# Patient Record
Sex: Female | Born: 1995 | Race: White | Hispanic: No | Marital: Married | Smoking: Never smoker
Health system: Southern US, Community
[De-identification: ages and names within clinical notes are randomized; demographics above are authoritative.]

## PROBLEM LIST (undated history)

## (undated) DIAGNOSIS — H409 Unspecified glaucoma: Secondary | ICD-10-CM

## (undated) DIAGNOSIS — R000 Tachycardia, unspecified: Secondary | ICD-10-CM

## (undated) DIAGNOSIS — F419 Anxiety disorder, unspecified: Secondary | ICD-10-CM

## (undated) HISTORY — PX: EYE SURGERY: SHX253

## (undated) HISTORY — DX: Tachycardia, unspecified: R00.0

## (undated) HISTORY — PX: WISDOM TOOTH EXTRACTION: SHX21

---
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
## 2016-07-08 ENCOUNTER — Encounter (HOSPITAL_COMMUNITY): Payer: Self-pay | Admitting: Emergency Medicine

## 2016-07-08 ENCOUNTER — Emergency Department (HOSPITAL_COMMUNITY)
Admission: EM | Admit: 2016-07-08 | Discharge: 2016-07-08 | Disposition: A | Discharge status: Home / Self Care | Payer: Managed Care, Other (non HMO) | Attending: Emergency Medicine | Admitting: Emergency Medicine

## 2016-07-08 DIAGNOSIS — Y929 Unspecified place or not applicable: Secondary | ICD-10-CM | POA: Insufficient documentation

## 2016-07-08 DIAGNOSIS — S3992XA Unspecified injury of lower back, initial encounter: Secondary | ICD-10-CM | POA: Diagnosis present

## 2016-07-08 DIAGNOSIS — S39012A Strain of muscle, fascia and tendon of lower back, initial encounter: Secondary | ICD-10-CM | POA: Insufficient documentation

## 2016-07-08 DIAGNOSIS — M545 Low back pain, unspecified: Secondary | ICD-10-CM

## 2016-07-08 DIAGNOSIS — Y999 Unspecified external cause status: Secondary | ICD-10-CM | POA: Diagnosis not present

## 2016-07-08 DIAGNOSIS — Y9389 Activity, other specified: Secondary | ICD-10-CM | POA: Insufficient documentation

## 2016-07-08 DIAGNOSIS — X500XXA Overexertion from strenuous movement or load, initial encounter: Secondary | ICD-10-CM | POA: Insufficient documentation

## 2016-07-08 MED ORDER — METHOCARBAMOL 500 MG PO TABS
500.0000 mg | ORAL_TABLET | Freq: Once | ORAL | Status: AC
  Administered 2016-07-08: 500 mg via ORAL
  Filled 2016-07-08: qty 1

## 2016-07-08 MED ORDER — IBUPROFEN 600 MG PO TABS: 600.0000 mg | ORAL_TABLET | Freq: Four times a day (QID) | ORAL | 0 refills | Status: AC | PRN

## 2016-07-08 MED ORDER — METHOCARBAMOL 500 MG PO TABS: 500.0000 mg | ORAL_TABLET | Freq: Two times a day (BID) | ORAL | 0 refills | Status: DC

## 2016-07-08 NOTE — ED Triage Notes (Signed)
Pt is unable to void. Pt advised that we need a urine specimen

## 2016-07-08 NOTE — ED Provider Notes (Signed)
[facility]-EMERGENCY DEPT Provider Note   CSN: [id number] Arrival date & time: 07/08/16  1141  By signing my name below, I, Connie Mcclain, attest that this documentation has been prepared under the direction and in the presence of Connie Piliyler Ashlinn Hemrick, PA-C.  Electronically Signed: Rosario AdieWilliam Andrew Mcclain, ED Scribe. 07/08/16. 12:28 PM.  History   Chief Complaint Chief Complaint  Patient presents with  . Back Pain   The history is provided by the patient. No language interpreter was used.   HPI Comments: Connie LudwigZoe Mcclain is a 21 y.o. female with no pertinent PMHx, who presents to the Emergency Department complaining of persistent, dull, right-sided lower back pain beginning several days ago, worsening since two days ago. She reports radiation of her pain laterally around her right-side. No recent injury, trauma, falls; however, she notes that prior to the onset of her pain she was helping a friend move and doing a lot of activity/lifting. She also reports that two days ago after cleaning her home that her pain had acutely worsened. Her pain is worse with movement and alleviated in certain positions. She took Flexeril yesterday with some temporary relief of her pain; today she took Naproxen and Ibuprofen today w/o relief of her pain. No h/o cancer or IVDU. She denies bowel/bladder incontinence, saddle anaesthesia/paraesthesias, fever, dysuria, abdominal pain, nausea, vomiting, or any other associated symptoms.   History reviewed. No pertinent past medical history.  There are no active problems to display for this patient.  Past Surgical History:  Procedure Laterality Date  . EYE SURGERY    . WISDOM TOOTH EXTRACTION     OB History    No data available     Home Medications    Prior to Admission medications   Not on File   Family History No family history on file.  Social History Social History  Substance Use Topics  . Smoking status: Not on file  . Smokeless tobacco: Never Used  . Alcohol use  No     Comment: social    Allergies   Patient has no known allergies.  Review of Systems Review of Systems  Gastrointestinal: Negative for abdominal pain, nausea and vomiting.  Genitourinary: Negative for dysuria.  Musculoskeletal: Positive for back pain and myalgias.  Neurological: Negative for weakness and numbness.       Negative for bowel/bladder incontinence.    Physical Exam Updated Vital Signs BP 115/70 (BP Location: Left Arm)   Pulse 100   Temp 97.7 F (36.5 C) (Oral)   Resp 17   Ht 4\' 10"  (1.473 m)   LMP 06/09/2016   SpO2 97%   Physical Exam  Constitutional: She is oriented to person, place, and time. Vital signs are normal. She appears well-developed and well-nourished. No distress.  HENT:  Head: Normocephalic and atraumatic.  Right Ear: Hearing normal.  Left Ear: Hearing normal.  Eyes: Conjunctivae and EOM are normal. Pupils are equal, round, and reactive to light.  Neck: Normal range of motion.  Cardiovascular: Normal rate and regular rhythm.   Pulmonary/Chest: Effort normal.  Abdominal: She exhibits no distension.  Musculoskeletal: Normal range of motion.  TTP of the right lower lumbar musculature. No midline spinous process tenderness. No palpable deformities.   Neurological: She is alert and oriented to person, place, and time.  Skin: Skin is warm and dry. No pallor.  Psychiatric: She has a normal mood and affect. Her speech is normal and behavior is normal. Thought content normal.  Nursing note and vitals reviewed.  ED  Treatments / Results  DIAGNOSTIC STUDIES: Oxygen Saturation is 97% on RA, normal by my interpretation.   COORDINATION OF CARE: 12:28 PM-Discussed next steps with pt. Pt verbalized understanding and is agreeable with the plan.   Labs (all labs ordered are listed, but only abnormal results are displayed) Labs Reviewed  POC URINE PREG, ED   EKG  EKG Interpretation None      Radiology No results  found.  Procedures Procedures  Medications Ordered in ED Medications  methocarbamol (ROBAXIN) tablet 500 mg (not administered)    Initial Impression / Assessment and Plan / ED Course  I have reviewed the triage vital signs and the nursing notes.  Pertinent labs & imaging results that were available during my care of the patient were reviewed by me and considered in my medical decision making (see chart for details).  I have reviewed and evaluated the relevant laboratory values. I obtained HPI from historian.  ED Course: Robaxin dose and rx for same  Assessment: Patient is a 21 y.o. female without pertinent hx who presents to the ED with lower back pain. Likely musculoskeletal. Lumbar strain. No neurological deficits appreciated. Patient is ambulatory. No warning symptoms of back pain including: fecal incontinence, urinary retention or overflow incontinence, night sweats, waking from sleep with back pain, unexplained fevers or weight loss, h/o cancer, IVDU, recent trauma. No concern for cauda equina, epidural abscess, or other serious cause of back pain. Conservative measures such as rest, ice/heat and pain medicine indicated with PCP follow-up if no improvement with conservative management.   Disposition/Plan:  D/c home Additional Verbal discharge instructions given and discussed with patient.  Pt Instructed to f/u with PCP in the next week for evaluation and treatment of symptoms. Return precautions given Pt acknowledges and agrees with plan  Supervising Physician Connie Mcclain, Connie Repress, MD  Final Clinical Impressions(s) / ED Diagnoses   Final diagnoses:  Acute right-sided low back pain without sciatica  Strain of lumbar region, initial encounter   New Prescriptions New Prescriptions   No medications on file   I personally performed the services described in this documentation, which was scribed in my presence. The recorded information has been reviewed and is accurate.    Connie Pili, PA-C 07/08/16 1233    Connie Mcclain, Connie Repress, MD 07/08/16 [phone]

## 2016-07-08 NOTE — ED Triage Notes (Signed)
Patient states that she has right lower back pain x couple days. Patient denies any injury, lifting, or urinary problems.  Patient states that pain is worse with movement.

## 2017-06-03 ENCOUNTER — Emergency Department (HOSPITAL_COMMUNITY)
Admission: EM | Admit: 2017-06-03 | Discharge: 2017-06-03 | Disposition: A | Discharge status: Home / Self Care | Payer: Managed Care, Other (non HMO) | Attending: Emergency Medicine | Admitting: Emergency Medicine

## 2017-06-03 ENCOUNTER — Emergency Department (HOSPITAL_COMMUNITY): Payer: Managed Care, Other (non HMO)

## 2017-06-03 ENCOUNTER — Encounter (HOSPITAL_COMMUNITY): Payer: Self-pay | Admitting: *Deleted

## 2017-06-03 ENCOUNTER — Other Ambulatory Visit: Payer: Self-pay

## 2017-06-03 DIAGNOSIS — S161XXA Strain of muscle, fascia and tendon at neck level, initial encounter: Secondary | ICD-10-CM | POA: Diagnosis not present

## 2017-06-03 DIAGNOSIS — S199XXA Unspecified injury of neck, initial encounter: Secondary | ICD-10-CM | POA: Diagnosis present

## 2017-06-03 DIAGNOSIS — F419 Anxiety disorder, unspecified: Secondary | ICD-10-CM | POA: Insufficient documentation

## 2017-06-03 DIAGNOSIS — Y92410 Unspecified street and highway as the place of occurrence of the external cause: Secondary | ICD-10-CM | POA: Diagnosis not present

## 2017-06-03 DIAGNOSIS — Y9389 Activity, other specified: Secondary | ICD-10-CM | POA: Diagnosis not present

## 2017-06-03 DIAGNOSIS — Y998 Other external cause status: Secondary | ICD-10-CM | POA: Insufficient documentation

## 2017-06-03 HISTORY — DX: Anxiety disorder, unspecified: F41.9

## 2017-06-03 HISTORY — DX: Unspecified glaucoma: H40.9

## 2017-06-03 MED ORDER — CYCLOBENZAPRINE HCL 10 MG PO TABS
10.0000 mg | ORAL_TABLET | Freq: Once | ORAL | Status: AC
  Administered 2017-06-03: 10 mg via ORAL
  Filled 2017-06-03: qty 1

## 2017-06-03 MED ORDER — CYCLOBENZAPRINE HCL 10 MG PO TABS: 10.0000 mg | ORAL_TABLET | Freq: Two times a day (BID) | ORAL | 0 refills | Status: AC | PRN

## 2017-06-03 MED ORDER — NAPROXEN 500 MG PO TABS: 500.0000 mg | ORAL_TABLET | Freq: Two times a day (BID) | ORAL | 0 refills | Status: DC
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
## 2017-06-03 NOTE — Discharge Instructions (Signed)
Do not drive while taking the muscle relaxer as it will make you sleepy. Follow up with your doctor. Return here as needed. °
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
## 2017-06-03 NOTE — ED Notes (Signed)
To rad 

## 2017-08-09 ENCOUNTER — Other Ambulatory Visit: Payer: Self-pay | Admitting: Orthopedic Surgery

## 2017-08-09 DIAGNOSIS — M542 Cervicalgia: Secondary | ICD-10-CM

## 2017-08-29 ENCOUNTER — Ambulatory Visit
Admission: RE | Admit: 2017-08-29 | Discharge: 2017-08-29 | Disposition: A | Discharge status: Home / Self Care | Payer: Managed Care, Other (non HMO) | Source: Ambulatory Visit | Attending: Orthopedic Surgery | Admitting: Orthopedic Surgery

## 2017-08-29 DIAGNOSIS — M542 Cervicalgia: Secondary | ICD-10-CM

## 2018-06-08 ENCOUNTER — Other Ambulatory Visit: Payer: Self-pay | Admitting: Orthopedic Surgery

## 2018-06-08 DIAGNOSIS — M545 Low back pain, unspecified: Secondary | ICD-10-CM

## 2018-06-17 ENCOUNTER — Ambulatory Visit
Admission: RE | Admit: 2018-06-17 | Discharge: 2018-06-17 | Disposition: A | Discharge status: Home / Self Care | Payer: Managed Care, Other (non HMO) | Source: Ambulatory Visit | Attending: Orthopedic Surgery | Admitting: Orthopedic Surgery

## 2018-06-17 ENCOUNTER — Other Ambulatory Visit: Payer: Self-pay

## 2018-06-17 DIAGNOSIS — M545 Low back pain, unspecified: Secondary | ICD-10-CM

## 2018-12-30 ENCOUNTER — Emergency Department (HOSPITAL_COMMUNITY): Payer: Managed Care, Other (non HMO)

## 2018-12-30 ENCOUNTER — Encounter (HOSPITAL_COMMUNITY): Payer: Self-pay

## 2018-12-30 ENCOUNTER — Other Ambulatory Visit: Payer: Self-pay

## 2018-12-30 ENCOUNTER — Emergency Department (HOSPITAL_COMMUNITY)
Admission: EM | Admit: 2018-12-30 | Discharge: 2018-12-30 | Disposition: A | Discharge status: Home / Self Care | Payer: Managed Care, Other (non HMO) | Attending: Emergency Medicine | Admitting: Emergency Medicine

## 2018-12-30 DIAGNOSIS — Z79899 Other long term (current) drug therapy: Secondary | ICD-10-CM | POA: Insufficient documentation

## 2018-12-30 DIAGNOSIS — Y93K1 Activity, walking an animal: Secondary | ICD-10-CM | POA: Insufficient documentation

## 2018-12-30 DIAGNOSIS — S8991XA Unspecified injury of right lower leg, initial encounter: Secondary | ICD-10-CM | POA: Diagnosis present

## 2018-12-30 DIAGNOSIS — Y999 Unspecified external cause status: Secondary | ICD-10-CM | POA: Insufficient documentation

## 2018-12-30 DIAGNOSIS — S89301A Unspecified physeal fracture of lower end of right fibula, initial encounter for closed fracture: Secondary | ICD-10-CM | POA: Diagnosis not present

## 2018-12-30 DIAGNOSIS — Y929 Unspecified place or not applicable: Secondary | ICD-10-CM | POA: Insufficient documentation

## 2018-12-30 DIAGNOSIS — W010XXA Fall on same level from slipping, tripping and stumbling without subsequent striking against object, initial encounter: Secondary | ICD-10-CM | POA: Insufficient documentation

## 2018-12-30 DIAGNOSIS — S82831A Other fracture of upper and lower end of right fibula, initial encounter for closed fracture: Secondary | ICD-10-CM

## 2018-12-30 MED ORDER — ONDANSETRON HCL 4 MG PO TABS
4.0000 mg | ORAL_TABLET | Freq: Once | ORAL | Status: AC
  Administered 2018-12-30: 4 mg via ORAL
  Filled 2018-12-30: qty 1

## 2018-12-30 MED ORDER — ONDANSETRON 4 MG PO TBDP: 4.0000 mg | ORAL_TABLET | Freq: Three times a day (TID) | ORAL | 0 refills | Status: AC | PRN

## 2018-12-30 MED ORDER — HYDROCODONE-ACETAMINOPHEN 5-325 MG PO TABS
1.0000 | ORAL_TABLET | Freq: Once | ORAL | Status: AC
  Administered 2018-12-30: 1 via ORAL
  Filled 2018-12-30: qty 1

## 2018-12-30 MED ORDER — HYDROCODONE-ACETAMINOPHEN 5-325 MG PO TABS: 1.0000 | ORAL_TABLET | Freq: Three times a day (TID) | ORAL | 0 refills | Status: DC | PRN

## 2018-12-30 NOTE — ED Notes (Signed)
Pt verbalizes understanding of DC instructions. Pt belongings returned and was assisted out of ED in wheelchair and crutches

## 2018-12-30 NOTE — ED Notes (Signed)
Ortho called 

## 2018-12-30 NOTE — ED Notes (Signed)
[place]

## 2018-12-30 NOTE — ED Notes (Signed)
Ortho tech remains at bedside fitting boot

## 2018-12-30 NOTE — ED Provider Notes (Signed)
[facility]-EMERGENCY DEPT Provider Note   CSN: [id number] Arrival date & time: 12/30/18  1619     History   Chief Complaint Chief Complaint  Patient presents with   Ankle Pain    HPI Connie Mcclain is a 23 y.o. female who presents today for evaluation of right leg pain status post mechanical fall that occurred approximate 4:30 PM this afternoon.  Patient reports that she was walking her dog and states that she slipped on the mud, causing her right lower extremity to bend backwards.  She reports no head injury, LOC.  She reports that since the incident, she is not been able to ambulate or bear weight on the right lower extremity secondary to pain.  She has not taken any medications for pain.  Patient denies any numbness/weakness.     The history is provided by the patient.    Past Medical History:  Diagnosis Date   Anxiety    Glaucoma     There are no active problems to display for this patient.   Past Surgical History:  Procedure Laterality Date   EYE SURGERY     WISDOM TOOTH EXTRACTION       OB History   No obstetric history on file.      Home Medications    Prior to Admission medications   Medication Sig Start Date End Date Taking? Authorizing Provider  cyclobenzaprine (FLEXERIL) 10 MG tablet Take 1 tablet (10 mg total) by mouth 2 (two) times daily as needed for muscle spasms. 06/03/17   Janne NapoleonNeese, Hope M, NP  HYDROcodone-acetaminophen (NORCO/VICODIN) 5-325 MG tablet Take 1-2 tablets by mouth every 8 (eight) hours as needed. 12/30/18   Maxwell CaulLayden, Omah Dewalt A, PA-C  ibuprofen (ADVIL,MOTRIN) 600 MG tablet Take 1 tablet (600 mg total) by mouth every 6 (six) hours as needed. 07/08/16   Audry PiliMohr, Tyler, PA-C  methocarbamol (ROBAXIN) 500 MG tablet Take 1 tablet (500 mg total) by mouth 2 (two) times daily. 07/08/16   Audry PiliMohr, Tyler, PA-C  naproxen (NAPROSYN) 500 MG tablet Take 1 tablet (500 mg total) by mouth 2 (two) times daily. 06/03/17   Janne NapoleonNeese, Hope M, NP    ondansetron (ZOFRAN ODT) 4 MG disintegrating tablet Take 1 tablet (4 mg total) by mouth every 8 (eight) hours as needed for nausea or vomiting. 12/30/18   Maxwell CaulLayden, Teriyah Purington A, PA-C    Family History History reviewed. No pertinent family history.  Social History Social History   Tobacco Use   Smoking status: Never Smoker   Smokeless tobacco: Never Used  Substance Use Topics   Alcohol use: No    Comment: social    Drug use: Never     Allergies   Patient has no known allergies.   Review of Systems Review of Systems  Musculoskeletal:       Right leg pain  Neurological: Negative for weakness and numbness.  All other systems reviewed and are negative.    Physical Exam Updated Vital Signs BP 132/90    Pulse (!) 114    Temp 98.9 F (37.2 C) (Oral)    Resp 16    LMP 12/24/2018    SpO2 100%   Physical Exam Vitals signs and nursing note reviewed.  Constitutional:      Appearance: She is well-developed.  HENT:     Head: Normocephalic and atraumatic.  Neck:     Musculoskeletal: Normal range of motion.  Cardiovascular:     Pulses:  Dorsalis pedis pulses are 2+ on the right side and 2+ on the left side.  Pulmonary:     Effort: Pulmonary effort is normal.  Musculoskeletal:     Comments: Tenderness palpation noted to the lateral malleolus of the right ankle that extends over to the distal tib-fib.  There is some overlying soft tissue swelling. Compartments are soft.  No deformity or crepitus noted.  No tenderness palpation of the right hip, right femur, right knee.  No tenderness palpation of the left lower extremity.  Patient can wiggle all 5 toes of right foot without any difficulty.    Skin:    General: Skin is warm and dry.     Capillary Refill: Capillary refill takes less than 2 seconds.     Comments: The skin is intact to ankle/foot.  The foot is warm and well perfused with intact sensation  Neurological:     Comments: Sensation intact throughout all major  nerve distributions of the feet       ED Treatments / Results  Labs (all labs ordered are listed, but only abnormal results are displayed) Labs Reviewed - No data to display  EKG None  Radiology Dg Ankle Complete Right  Result Date: 12/30/2018 CLINICAL DATA:  Status post fall. EXAM: RIGHT ANKLE - COMPLETE 3+ VIEW COMPARISON:  12/14/2008 FINDINGS: Diffuse soft tissue swelling is identified. Chronic appearing well corticated osteophytic density adjacent to the tip of the lateral malleolus is unchanged from 12/14/2008. There is an acute, nondisplaced fracture deformity involving the distal fibula. No dislocations identified. IMPRESSION: 1. Acute, nondisplaced fracture deformity involving the distal fibula. 2. Well corticated ossified density adjacent to tip of the lateral malleolus is unchanged and favored to represent sequela of prior trauma. Electronically Signed   By: Signa Kell M.D.   On: 12/30/2018 17:17    Procedures Procedures (including critical care time)  Medications Ordered in ED Medications  HYDROcodone-acetaminophen (NORCO/VICODIN) 5-325 MG per tablet 1 tablet (1 tablet Oral Given 12/30/18 1907)  ondansetron (ZOFRAN) tablet 4 mg (4 mg Oral Given 12/30/18 1907)     Initial Impression / Assessment and Plan / ED Course  I have reviewed the triage vital signs and the nursing notes.  Pertinent labs & imaging results that were available during my care of the patient were reviewed by me and considered in my medical decision making (see chart for details).        23 year old female who presents for evaluation of right leg pain status post mechanical fall that occurred at about 4:30 PM this afternoon.  No head injury, LOC.  Initial ED arrival, she is afebrile, nontoxic-appearing.  She is slightly tachycardic, likely secondary to pain.  Patient is neurovascularly intact.  On exam, she has tenderness palpation of the lateral malleolus of the right ankle that extends to the  distal tib-fib.  No deformity or crepitus noted but does have some overlying soft tissue swelling.  Patient with good distal cap refill, pulses.  Concern for fracture versus dislocation versus sprain.  X-ray ordered at triage.  X-ray reviewed.  There is an acute, nondisplaced fracture noted in the distal fibula.  Discussed results with patient.  We will plan to put her in a cam walker boot and crutches.  Patient instructed that she will need to follow-up with outpatient orthopedics.  Will give short course of pain medication for acute pain.  Patient instructed to follow-up with Ortho as directed. At this time, patient exhibits no emergent life-threatening condition that require further  evaluation in ED or admisison. Patient had ample opportunity for questions and discussion. All patient's questions were answered with full understanding. Strict return precautions discussed. Patient expresses understanding and agreement to plan.   Portions of this note were generated with [employer]. Dictation errors may occur despite best attempts at proofreading.   Final Clinical Impressions(s) / ED Diagnoses   Final diagnoses:  Closed fracture of distal end of right fibula, unspecified fracture morphology, initial encounter    ED Discharge Orders         Ordered    HYDROcodone-acetaminophen (NORCO/VICODIN) 5-325 MG tablet  Every 8 hours PRN     12/30/18 1856    ondansetron (ZOFRAN ODT) 4 MG disintegrating tablet  Every 8 hours PRN     12/30/18 1856           Volanda Napoleon, PA-C 12/30/18 Sherilyn Dacosta, MD 12/30/18 2201

## 2018-12-30 NOTE — ED Notes (Signed)
Layden PA at bedside 

## 2018-12-30 NOTE — Discharge Instructions (Signed)
You can take Tylenol or Ibuprofen as directed for pain. You can alternate Tylenol and Ibuprofen every 4 hours. If you take Tylenol at 1pm, then you can take Ibuprofen at 5pm. Then you can take Tylenol again at 9pm.   You can take pain medication for severe or breakthrough pain.   Follow-up with referred orthopedic doctor.  Call their office and arrange for an appointment.  Return to the emergency department for any worsening pain, numbness/weakness of the toes, fevers or any other worsening or concerning symptoms.

## 2018-12-30 NOTE — ED Triage Notes (Signed)
Pt was walking dog and tripped and fell, injuring right ankle.

## 2019-01-05 ENCOUNTER — Ambulatory Visit: Payer: Managed Care, Other (non HMO) | Admitting: Orthopaedic Surgery

## 2019-01-05 ENCOUNTER — Ambulatory Visit (HOSPITAL_BASED_OUTPATIENT_CLINIC_OR_DEPARTMENT_OTHER)
Admission: RE | Admit: 2019-01-05 | Discharge: 2019-01-05 | Disposition: A | Discharge status: Home / Self Care | Payer: Managed Care, Other (non HMO) | Source: Ambulatory Visit | Attending: Sports Medicine | Admitting: Sports Medicine

## 2019-01-05 ENCOUNTER — Other Ambulatory Visit: Payer: Self-pay

## 2019-01-05 ENCOUNTER — Other Ambulatory Visit (HOSPITAL_BASED_OUTPATIENT_CLINIC_OR_DEPARTMENT_OTHER): Payer: Self-pay | Admitting: Sports Medicine

## 2019-01-05 DIAGNOSIS — R609 Edema, unspecified: Secondary | ICD-10-CM | POA: Insufficient documentation

## 2020-08-31 IMAGING — US US EXTREM LOW VENOUS*R*
1 series · 13 of 24 positions shown · non-contrast
Comparison: None.

CLINICAL DATA: Pain and swelling. Status post right fibula fracture
6 days ago.



[Series 1: us extrem low venous*right* · 13 of 30 slices shown]
[im 1/30]
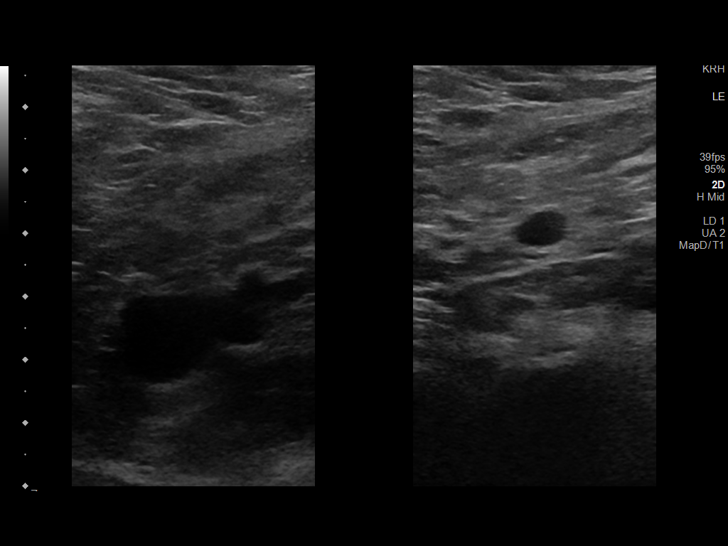
[im 3/30]
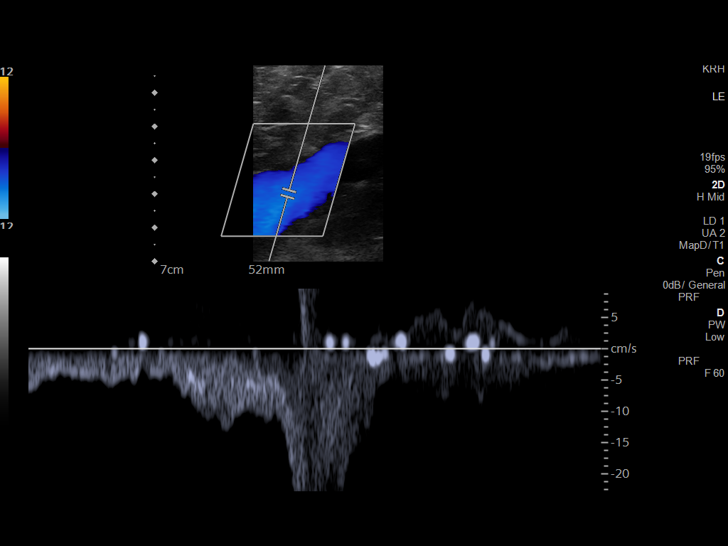
[im 6/30]
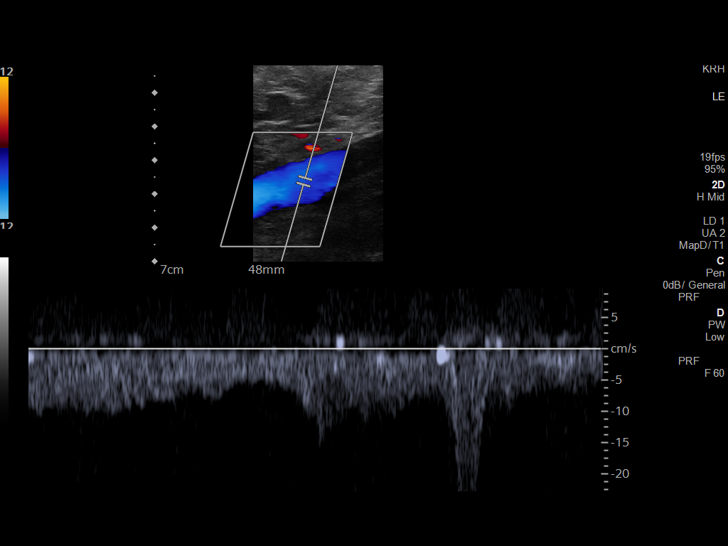
[im 8/30]
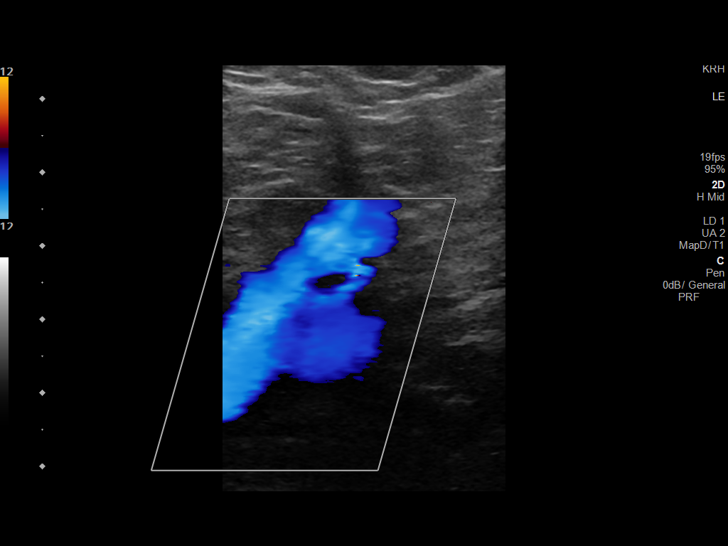
[im 11/30]
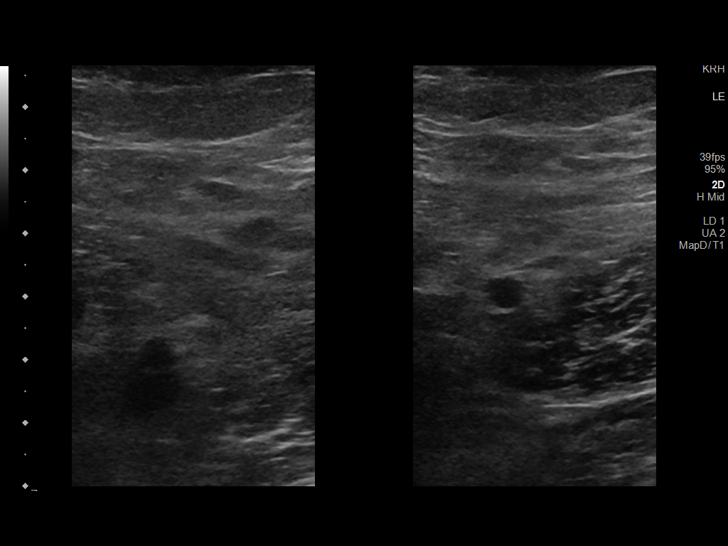
[im 13/30]
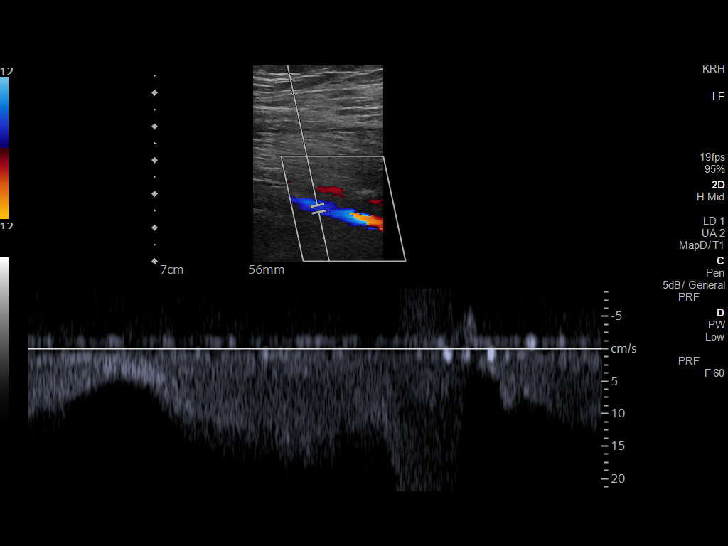
[im 16/30]
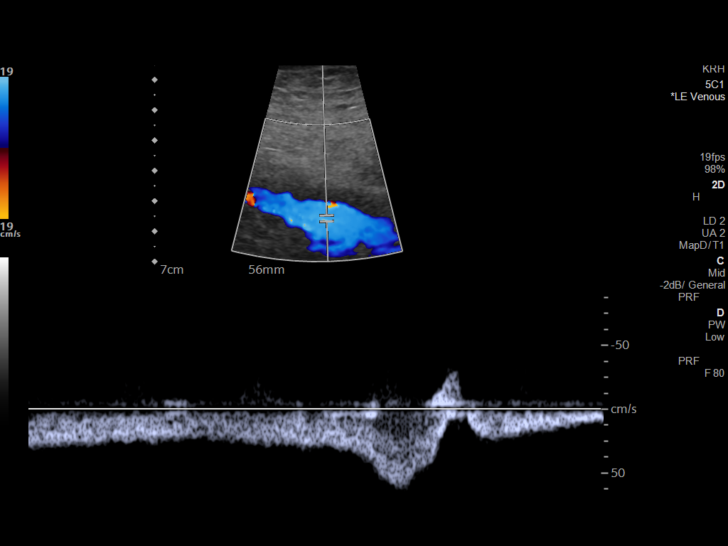
[im 17/30]
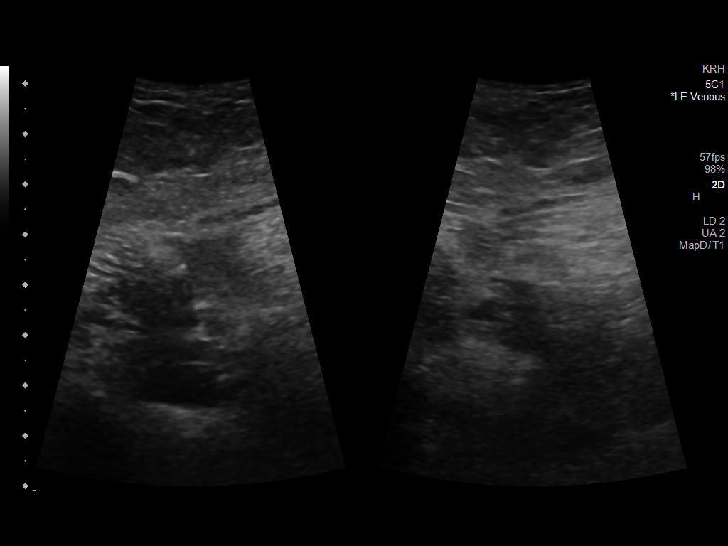
[im 19/30]
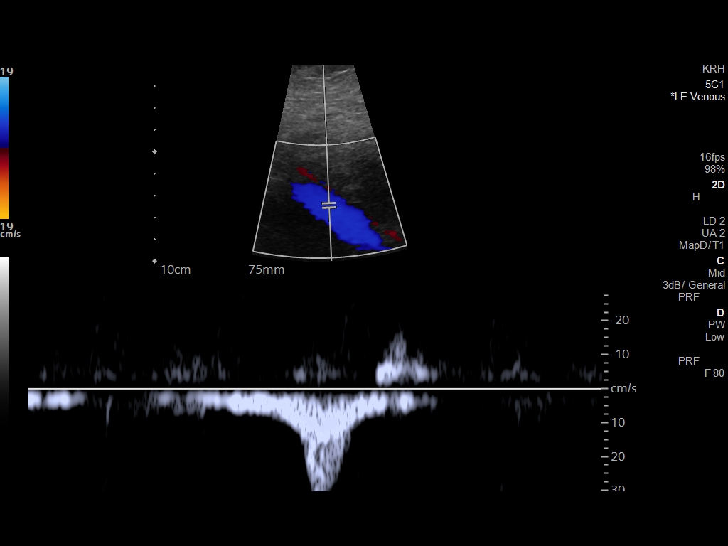
[im 22/30]
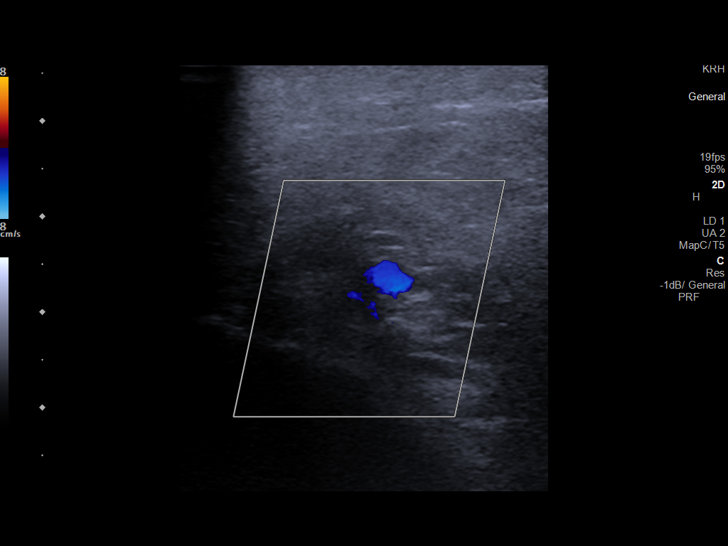
[im 24/30]
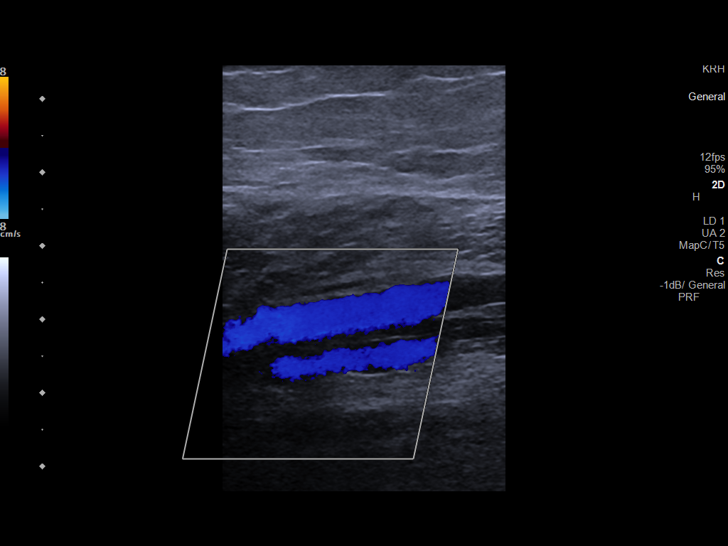
[im 27/30]
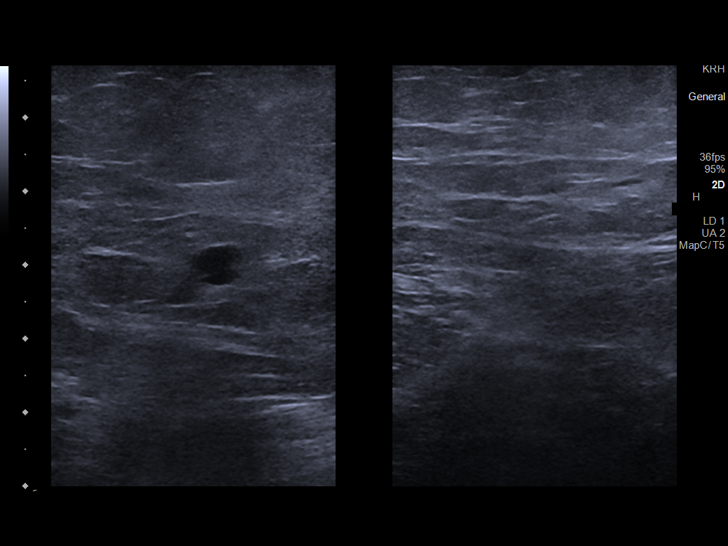
[im 30/30]
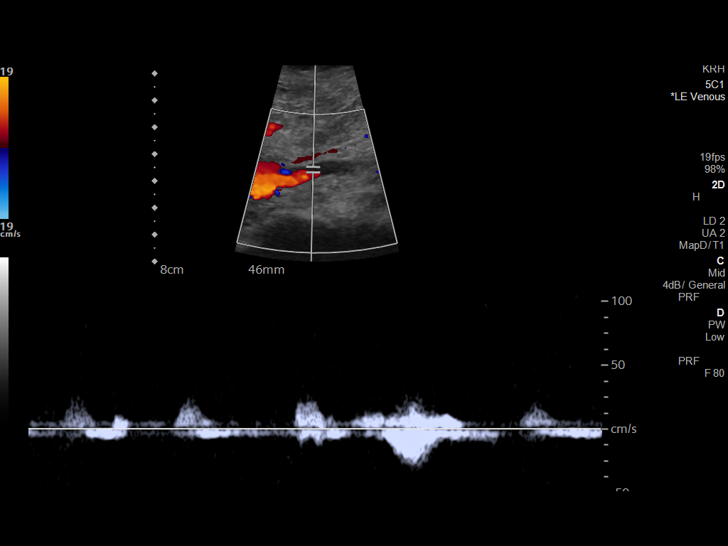

[13 of 24 positions shown; findings below may reference images not displayed]

FINDINGS: Contralateral Common Femoral Vein: Respiratory phasicity is normal
and symmetric with the symptomatic side. No evidence of thrombus.
Normal compressibility.

Common Femoral Vein: No evidence of thrombus. Normal
compressibility, respiratory phasicity and response to augmentation.

Saphenofemoral Junction: No evidence of thrombus. Normal
compressibility and flow on color Doppler imaging.

Profunda Femoral Vein: No evidence of thrombus. Normal
compressibility and flow on color Doppler imaging.

Femoral Vein: No evidence of thrombus. Normal compressibility,
respiratory phasicity and response to augmentation.

Popliteal Vein: No evidence of thrombus. Normal compressibility,
respiratory phasicity and response to augmentation.

Calf Veins: The posterior tibial vein was unremarkable. The peroneal
vein could not be visualized secondary to patient body habitus.

Superficial Great Saphenous Vein: No evidence of thrombus. Normal
compressibility.

Venous Reflux:  None.

Other Findings:  None.
IMPRESSION: No evidence of deep venous thrombosis.

## 2023-06-09 ENCOUNTER — Other Ambulatory Visit: Payer: Self-pay

## 2023-06-09 ENCOUNTER — Other Ambulatory Visit: Payer: Self-pay | Admitting: Physician Assistant

## 2023-06-09 ENCOUNTER — Encounter (HOSPITAL_BASED_OUTPATIENT_CLINIC_OR_DEPARTMENT_OTHER): Payer: Self-pay | Admitting: Emergency Medicine

## 2023-06-09 ENCOUNTER — Emergency Department (HOSPITAL_BASED_OUTPATIENT_CLINIC_OR_DEPARTMENT_OTHER)
Admission: EM | Admit: 2023-06-09 | Discharge: 2023-06-09 | Disposition: A | Discharge status: Home / Self Care | Attending: Emergency Medicine | Admitting: Emergency Medicine

## 2023-06-09 ENCOUNTER — Emergency Department (HOSPITAL_BASED_OUTPATIENT_CLINIC_OR_DEPARTMENT_OTHER): Admitting: Radiology

## 2023-06-09 DIAGNOSIS — R0602 Shortness of breath: Secondary | ICD-10-CM | POA: Diagnosis not present

## 2023-06-09 DIAGNOSIS — R110 Nausea: Secondary | ICD-10-CM | POA: Insufficient documentation

## 2023-06-09 DIAGNOSIS — R000 Tachycardia, unspecified: Secondary | ICD-10-CM | POA: Diagnosis present

## 2023-06-09 DIAGNOSIS — M259 Joint disorder, unspecified: Secondary | ICD-10-CM

## 2023-06-09 LAB — BASIC METABOLIC PANEL WITH GFR
Anion gap: 14 (ref 5–15)
BUN: 9 mg/dL (ref 6–20)
CO2: 20 mmol/L — ABNORMAL LOW (ref 22–32)
Calcium: 9.5 mg/dL (ref 8.9–10.3)
Chloride: 104 mmol/L (ref 98–111)
Creatinine, Ser: 0.62 mg/dL (ref 0.44–1.00)
GFR, Estimated: >60 mL/min (ref >60)
Glucose, Bld: 104 mg/dL — ABNORMAL HIGH (ref 70–99)
Potassium: 3.6 mmol/L (ref 3.5–5.1)
Sodium: 138 mmol/L (ref 135–145)

## 2023-06-09 LAB — CBC
HCT: 43.3 % (ref 36.0–46.0)
Hemoglobin: 14.6 g/dL (ref 12.0–15.0)
MCH: 28.5 pg (ref 26.0–34.0)
MCHC: 33.7 g/dL (ref 30.0–36.0)
MCV: 84.6 fL (ref 80.0–100.0)
Platelets: 306 10*3/uL (ref 150–400)
RBC: 5.12 MIL/uL — ABNORMAL HIGH (ref 3.87–5.11)
RDW: 12.6 % (ref 11.5–15.5)
WBC: 8.5 10*3/uL (ref 4.0–10.5)
nRBC: 0 % (ref 0.0–0.2)

## 2023-06-09 LAB — TROPONIN T, HIGH SENSITIVITY
Troponin T High Sensitivity: <15 ng/L (ref <19)
Troponin T High Sensitivity: <15 ng/L (ref <19)

## 2023-06-09 LAB — PROTIME-INR
INR: 0.9 (ref 0.8–1.2)
Prothrombin Time: 12.2 s (ref 11.4–15.2)

## 2023-06-09 LAB — TSH: TSH: 1.67 u[IU]/mL (ref 0.350–4.500)

## 2023-06-09 LAB — D-DIMER, QUANTITATIVE: D-Dimer, Quant: <0.27 ug{FEU}/mL (ref 0.00–0.50)

## 2023-06-09 LAB — PREGNANCY, URINE: Preg Test, Ur: NEGATIVE

## 2023-06-09 LAB — HCG, SERUM, QUALITATIVE: Preg, Serum: NEGATIVE

## 2023-06-09 MED ORDER — SODIUM CHLORIDE 0.9 % IV BOLUS
1000.0000 mL | Freq: Once | INTRAVENOUS | Status: AC
  Administered 2023-06-09: 1000 mL via INTRAVENOUS

## 2023-06-09 NOTE — ED Triage Notes (Signed)
 Pt via pov from home with tachycardia x 1 month. She reports it has been happening most days during this month. She saw pcp, who referred her to cardiology; she has appointment June 6, but doesn't feel like she can wait. Other symptoms include flushing, dizziness. Endorses some nausea, no emesis. Pt alert & oriented, nad noted.

## 2023-06-09 NOTE — ED Notes (Signed)
 Pt called out to the nurses station. This RN went in to check on patient and was was c/o not being heard. Pt reports she feels like the provider is not doing a good job with her chest discomfort. This RN reassure patient that we are doing everything we can to figure out what is wrong. Pt reports understand and is resting at this time.

## 2023-06-09 NOTE — ED Provider Notes (Signed)
 [place] [facility]MENT AT [facility] Provider Note   CSN: [id number] Arrival date & time: 06/09/23  1808     History  Chief Complaint  Patient presents with   Tachycardia    Connie Mcclain is a 28 y.o. female.  With a history of anxiety presents for tachycardia.  She has experienced episodic tachycardia for the last month.  She has seen her PCP for this who has referred her to a cardiologist and she has a standing appointment on June 6 but feels too uncomfortable to wait that long.  During these episodes tachycardia she feels flushed and dizzy with some nausea and shortness of breath.  No fevers chills or GI symptoms.  No new medications.  Denies alcohol or drug use and is compliant with her Adderall prescription  HPI     Home Medications Prior to Admission medications   Medication Sig Start Date End Date Taking? Authorizing Provider  cyclobenzaprine  (FLEXERIL ) 10 MG tablet Take 1 tablet (10 mg total) by mouth 2 (two) times daily as needed for muscle spasms. 06/03/17   Hardie Leyland, NP  HYDROcodone -acetaminophen  (NORCO/VICODIN) 5-325 MG tablet Take 1-2 tablets by mouth every 8 (eight) hours as needed. 12/30/18   Valla Gauss, PA-C  ibuprofen  (ADVIL ,MOTRIN ) 600 MG tablet Take 1 tablet (600 mg total) by mouth every 6 (six) hours as needed. 07/08/16   Tonya Fredrickson, PA-C  methocarbamol  (ROBAXIN ) 500 MG tablet Take 1 tablet (500 mg total) by mouth 2 (two) times daily. 07/08/16   Tonya Fredrickson, PA-C  naproxen  (NAPROSYN ) 500 MG tablet Take 1 tablet (500 mg total) by mouth 2 (two) times daily. 06/03/17   Hardie Leyland, NP  ondansetron  (ZOFRAN  ODT) 4 MG disintegrating tablet Take 1 tablet (4 mg total) by mouth every 8 (eight) hours as needed for nausea or vomiting. 12/30/18   Layden, Lindsey A, PA-C      Allergies    Patient has no known allergies.    Review of Systems   Review of Systems  Physical Exam Updated Vital Signs BP 100/65 (BP Location: Right Arm)   Pulse 90    Temp 97.8 F (36.6 C) (Oral)   Resp 18   Ht 4\' 11"  (1.499 m)   Wt 95.7 kg   LMP 06/07/2023 (Approximate)   SpO2 97%   BMI 42.62 kg/m  Physical Exam Vitals and nursing note reviewed.  HENT:     Head: Normocephalic and atraumatic.  Eyes:     Pupils: Pupils are equal, round, and reactive to light.  Cardiovascular:     Rate and Rhythm: Regular rhythm. Tachycardia present.  Pulmonary:     Effort: Pulmonary effort is normal.     Breath sounds: Normal breath sounds.  Abdominal:     Palpations: Abdomen is soft.     Tenderness: There is no abdominal tenderness.  Skin:    General: Skin is warm and dry.  Neurological:     Mental Status: She is alert.  Psychiatric:        Mood and Affect: Mood normal.     ED Results / Procedures / Treatments   Labs (all labs ordered are listed, but only abnormal results are displayed) Labs Reviewed  BASIC METABOLIC PANEL WITH GFR - Abnormal; Notable for the following components:      Result Value   CO2 20 (*)    Glucose, Bld 104 (*)    All other components within normal limits  CBC - Abnormal; Notable for the following components:  RBC 5.12 (*)    All other components within normal limits  PREGNANCY, URINE  PROTIME-INR  D-DIMER, QUANTITATIVE  TSH  HCG, SERUM, QUALITATIVE  T4, FREE  TROPONIN T, HIGH SENSITIVITY  TROPONIN T, HIGH SENSITIVITY    EKG EKG Interpretation Date/Time:  Thursday Jun 09 2023 18:24:15 EDT Ventricular Rate:  133 PR Interval:  90 QRS Duration:  78 QT Interval:  294 QTC Calculation: 437 R Axis:   77  Text Interpretation: Sinus tachycardia with short PR Cannot rule out Anterior infarct , age undetermined Abnormal ECG No previous ECGs available Confirmed by Rafael Bun [phone]) on 06/09/2023 11:08:30 PM  Radiology DG Chest 2 View Result Date: 06/09/2023 CLINICAL DATA:  Chest pain, tachycardia for 1 month EXAM: CHEST - 2 VIEW COMPARISON:  None Available. FINDINGS: The heart size and mediastinal contours are  within normal limits. Both lungs are clear. The visualized skeletal structures are unremarkable. IMPRESSION: No active cardiopulmonary disease. Electronically Signed   By: Bobbye Burrow M.D.   On: 06/09/2023 18:55    Procedures Procedures    Medications Ordered in ED Medications  sodium chloride 0.9 % bolus 1,000 mL (0 mLs Intravenous Stopped 06/09/23 2022)    ED Course/ Medical Decision Making/ A&P Clinical Course as of 06/09/23 2311  Thu Jun 09, 2023  2310 Pregnancy negative.  High sensitive troponin less than 15 not consistent with ACS.  D-dimer negative.  Low suspicion for PE.  No ischemic changes on EKG.  No significant electrolyte imbalance leukocytosis or anemia.  Chest x-ray shows no focal consolidation. Tachycardia has resolved patient now resting in normal sinus rhythm with rate in the 80s.  Repeat EKG looks okay.  She will follow-up with cardiology and knows what to come back to the ED for [MP]    Clinical Course User Index [MP] Connie Creamer, DO                                 Medical Decision Making 28 year old female with history as above presenting for 1 month of episodic tachycardia.  Tachycardic in 130s sinus rhythm on EKG.  Blood pressure stable.  Will evaluate for potential causes of tachycardia such as atypical ACS, hyperthyroidism, dehydration, electrolyte imbalance pulmonary embolism as well as infectious causes such as pneumonia.  Will obtain hCG to evaluate for potential pregnancy  Amount and/or Complexity of Data Reviewed Labs: ordered. Radiology: ordered.           Final Clinical Impression(s) / ED Diagnoses Final diagnoses:  Sinus tachycardia    Rx / DC Orders ED Discharge Orders     None         Connie Creamer, DO 06/09/23 2311

## 2023-06-09 NOTE — ED Notes (Signed)
 Patient called nurses station demanding dc paperwork. This RN went to check on patient and patient was crying. Pt reports she is ready to go . This RN helped patient take off monitoring system and IV.

## 2023-06-09 NOTE — Discharge Instructions (Addendum)
 You were seen in the emergency department for tachycardia (fast heart rate) Your blood work EKG and chest x-ray looked okay It is important that you follow-up with cardiology as scheduled to evaluate other causes of your tachycardia Your tachycardia resolved here and your heart rate and rhythm were normal Return to the emergency department for chest pain trouble breathing or any other concerns

## 2023-06-09 NOTE — ED Notes (Signed)
 Patient transported to X-ray

## 2023-06-10 LAB — T4, FREE: Free T4: 1.03 ng/dL (ref 0.61–1.12)

## 2023-07-01 ENCOUNTER — Ambulatory Visit: Admitting: Cardiology

## 2023-07-06 ENCOUNTER — Other Ambulatory Visit: Payer: Self-pay

## 2023-07-06 DIAGNOSIS — R000 Tachycardia, unspecified: Secondary | ICD-10-CM | POA: Insufficient documentation

## 2023-07-06 DIAGNOSIS — F419 Anxiety disorder, unspecified: Secondary | ICD-10-CM | POA: Insufficient documentation

## 2023-07-06 DIAGNOSIS — H409 Unspecified glaucoma: Secondary | ICD-10-CM | POA: Insufficient documentation

## 2023-07-07 ENCOUNTER — Ambulatory Visit

## 2023-07-07 VITALS — BP 108/68 | HR 104 | Ht 59.0 in | Wt 208.4 lb

## 2023-07-07 DIAGNOSIS — R002 Palpitations: Secondary | ICD-10-CM

## 2023-07-07 HISTORY — DX: Palpitations: R00.2

## 2023-07-07 NOTE — Assessment & Plan Note (Signed)
 Associated with sinus tachycardia and elevated heart rates. Able to monitor using smart watch at home. Spontaneous transient heart rate elevation up to 150 bpm.  No obvious thyroid  abnormalities. No significant functional changes recently. No significant medication changes recently.  Will obtain Zio patch monitor for 2 weeks to assess heart rate response and for any significant underlying arrhythmias.  Will also obtain transthoracic echocardiogram to rule out cardiac structural and functional abnormalities.  If these are reassuring, will trial low-dose beta-blocker therapy such as metoprolol.  If she continues to have symptoms of shortness of breath despite improved heart rates, we will consider further evaluation with treadmill exercise stress test.  Advised to avoid stimulants such as caffeine, over-the-counter energy drinks. Advised to keep herself well-hydrated.

## 2023-07-07 NOTE — Patient Instructions (Signed)
 Medication Instructions:  Your physician recommends that you continue on your current medications as directed. Please refer to the Current Medication list given to you today.  *If you need a refill on your cardiac medications before your next appointment, please call your pharmacy*  Lab Work: None If you have labs (blood work) drawn today and your tests are completely normal, you will receive your results only by: MyChart Message (if you have MyChart) OR A paper copy in the mail If you have any lab test that is abnormal or we need to change your treatment, we will call you to review the results.  Testing/Procedures: Your physician has requested that you have an echocardiogram. Echocardiography is a painless test that uses sound waves to create images of your heart. It provides your doctor with information about the size and shape of your heart and how well your heart's chambers and valves are working. This procedure takes approximately one hour. There are no restrictions for this procedure. Please do NOT wear cologne, perfume, aftershave, or lotions (deodorant is allowed). Please arrive 15 minutes prior to your appointment time.  Please note: We ask at that you not bring children with you during ultrasound (echo/ vascular) testing. Due to room size and safety concerns, children are not allowed in the ultrasound rooms during exams. Our front office staff cannot provide observation of children in our lobby area while testing is being conducted. An adult accompanying a patient to their appointment will only be allowed in the ultrasound room at the discretion of the ultrasound technician under special circumstances. We apologize for any inconvenience.  A zio monitor was ordered today. It will remain on for 14 days. You will then return monitor and event diary in provided box. It takes 1-2 weeks for report to be downloaded and returned to us . We will call you with the results. If monitor falls off or  has orange flashing light, please call Zio for further instructions.    Follow-Up: At [facility], you and your health needs are our priority.  As part of our continuing mission to provide you with exceptional heart care, our providers are all part of one team.  This team includes your primary Cardiologist (physician) and Advanced Practice Providers or APPs (Physician Assistants and Nurse Practitioners) who all work together to provide you with the care you need, when you need it.  Your next appointment:   6 week(s)  Provider:   Bertha Broad, MD    We recommend signing up for the patient portal called "MyChart".  Sign up information is provided on this After Visit Summary.  MyChart is used to connect with patients for Virtual Visits (Telemedicine).  Patients are able to view lab/test results, encounter notes, upcoming appointments, etc.  Non-urgent messages can be sent to your provider as well.   To learn more about what you can do with MyChart, go to [email or web address].   Other Instructions None

## 2023-07-07 NOTE — Progress Notes (Signed)
 Cardiology Consultation:    Date:  07/07/2023   ID:  Connie Mcclain, DOB Jul 28, 1995, MRN [id number]  PCP:  Patient, No Pcp Per  Cardiologist:  Angelena Kells, MD   Referring MD: Vanita Gens, MD   No chief complaint on file.    ASSESSMENT AND PLAN:   Connie Mcclain 28 year old  history of diabetes mellitus diagnosed about a year ago, hyperlipidemia, anxiety, ADHD [uses Adderall occasionally as needed], obesity, smokes marijuana daily.  Does not use tobacco or alcohol seen for further evaluation of palpitations associated with sense of elevated heart rate noted to be sinus tachycardia at prior healthcare visits.  Problem List Items Addressed This Visit     Palpitations - Primary   Associated with sinus tachycardia and elevated heart rates. Able to monitor using smart watch at home. Spontaneous transient heart rate elevation up to 150 bpm.  No obvious thyroid  abnormalities. No significant functional changes recently. No significant medication changes recently.  Will obtain Zio patch monitor for 2 weeks to assess heart rate response and for any significant underlying arrhythmias.  Will also obtain transthoracic echocardiogram to rule out cardiac structural and functional abnormalities.  If these are reassuring, will trial low-dose beta-blocker therapy such as metoprolol.  If she continues to have symptoms of shortness of breath despite improved heart rates, we will consider further evaluation with treadmill exercise stress test.  Advised to avoid stimulants such as caffeine, over-the-counter energy drinks. Advised to keep herself well-hydrated.      Relevant Orders   ECHOCARDIOGRAM COMPLETE   LONG TERM MONITOR (3-14 DAYS)      History of Present Illness:    Connie Mcclain is a 28 y.o. female who is being seen today for the evaluation of elevated heart rate sinus tachycardia at the request of Vanita Gens, MD.  Pleasant woman here for the visit accompanied by her mother. Works  at an office job.  Noted for exercise at this time and notably since her right procedure couple months ago   She reports history of diabetes mellitus diagnosed about a year ago, hyperlipidemia, anxiety, ADHD [uses Adderall occasionally as needed], obesity, smokes marijuana daily.  Does not use tobacco or alcohol.  With symptoms of fast heartbeat which he feels occurs on a regular basis and at times randomly and associated with symptoms of palpitations and chest discomfort.  At times associated with flushed feeling and dizziness. Prompted an ER visit on Jun 09, 2023 workup there was unremarkable and she was noted to be hemodynamically stable with heart rates in 90s.  Mentions she has not been able to exercise due to photosensitivity. Simple activity such as cleaning the house increases heart rates up to 150 bpm. Denies any syncopal episodes. Denies significant association with Adderall use as she feels the symptoms even on the days she is not using Adderall and no significant increase in symptoms when she takes Adderall.  Occasionally drinks coffee. Occasionally uses energy drinks Celsius. Occasionally drinks 1 can of Dr. Kathlene Paradise a day.  Father has history of dermatomyositis diagnosed in his 30s.  EKG available from 06-09-2023 noted sinus rhythm heart rate 83/min, PR interval 125 ms, QRS duration 92 ms, QTc 436 ms.  Thyroid  panel from 06/09/2023 was normal with TSH 1.67 Past Medical History:  Diagnosis Date   Anxiety    Glaucoma    Rapid heart rate     Past Surgical History:  Procedure Laterality Date   EYE SURGERY     WISDOM TOOTH EXTRACTION  Current Medications: Current Meds  Medication Sig   ALPRAZolam (XANAX) 1 MG tablet Take 1 mg by mouth daily as needed for anxiety.   amphetamine-dextroamphetamine (ADDERALL) 20 MG tablet Take 20 mg by mouth daily.   aspirin EC 81 MG tablet Take 81 mg by mouth daily.   cyclobenzaprine  (FLEXERIL ) 10 MG tablet Take 1 tablet (10 mg total) by  mouth 2 (two) times daily as needed for muscle spasms.   folic acid (FOLVITE) 1 MG tablet Take 1 mg by mouth daily.   gabapentin (NEURONTIN) 800 MG tablet Take 800 mg by mouth 2 (two) times daily.   ibuprofen  (ADVIL ,MOTRIN ) 600 MG tablet Take 1 tablet (600 mg total) by mouth every 6 (six) hours as needed.   ketorolac (ACULAR) 0.5 % ophthalmic solution Place 1 drop into the left eye daily.   LamoTRIgine (LAMICTAL XR) 300 MG TB24 24 hour tablet Take 300 mg by mouth daily.   latanoprost (XALATAN) 0.005 % ophthalmic solution Place 1 drop into both eyes at bedtime.   ondansetron  (ZOFRAN  ODT) 4 MG disintegrating tablet Take 1 tablet (4 mg total) by mouth every 8 (eight) hours as needed for nausea or vomiting.   risperiDONE (RISPERDAL) 0.5 MG tablet Take 0.5 mg by mouth at bedtime.   simvastatin (ZOCOR) 40 MG tablet Take 40 mg by mouth daily.   tirzepatide (MOUNJARO) 12.5 MG/0.5ML Pen Inject 12.5 mg into the skin once a week.     Allergies:   Patient has no known allergies.   Social History   Socioeconomic History   Marital status: Married    Spouse name: Not on file   Number of children: Not on file   Years of education: Not on file   Highest education level: Not on file  Occupational History   Not on file  Tobacco Use   Smoking status: Never   Smokeless tobacco: Never  Vaping Use   Vaping status: Every Day  Substance and Sexual Activity   Alcohol use: No    Comment: social    Drug use: Never   Sexual activity: Not on file  Other Topics Concern   Not on file  Social History Narrative   Not on file   Social Drivers of Health   Financial Resource Strain: Not on file  Food Insecurity: Not on file  Transportation Needs: Not on file  Physical Activity: Not on file  Stress: Not on file  Social Connections: Not on file     Family History: The patient's family history includes Cancer in her maternal aunt and paternal aunt; Diabetes in her father, maternal grandfather, and  maternal great-grandmother; Heart disease in her maternal grandfather. There is no history of Hypertension. ROS:   Please see the history of present illness.    All 14 point review of systems negative except as described per history of present illness.  EKGs/Labs/Other Studies Reviewed:    The following studies were reviewed today:   EKG:       Recent Labs: 06/09/2023: BUN 9; Creatinine, Ser 0.62; Hemoglobin 14.6; Platelets 306; Potassium 3.6; Sodium 138; TSH 1.670  Recent Lipid Panel No results found for: CHOL, TRIG, HDL, CHOLHDL, VLDL, LDLCALC, LDLDIRECT  Physical Exam:    VS:  BP 108/68   Pulse (!) 104   Ht 4' 11 (1.499 m)   Wt 208 lb 6.4 oz (94.5 kg)   LMP 06/07/2023 (Approximate)   SpO2 97%   BMI 42.09 kg/m     Wt Readings from Last 3 Encounters:  07/07/23 208 lb 6.4 oz (94.5 kg)  06/09/23 211 lb (95.7 kg)  06/03/17 220 lb (99.8 kg)     GENERAL:  Well nourished, well developed in no acute distress.  Extensive tattooing over her arms and legs. NECK: No JVD; No carotid bruits CARDIAC: RRR, S1 and S2 present, no murmurs, no rubs, no gallops CHEST:  Clear to auscultation without rales, wheezing or rhonchi  Extremities: No pitting pedal edema. Pulses bilaterally symmetric with radial 2+ and dorsalis pedis 2+ NEUROLOGIC:  Alert and oriented x 3  Medication Adjustments/Labs and Tests Ordered: Current medicines are reviewed at length with the patient today.  Concerns regarding medicines are outlined above.  Orders Placed This Encounter  Procedures   LONG TERM MONITOR (3-14 DAYS)   ECHOCARDIOGRAM COMPLETE   No orders of the defined types were placed in this encounter.   Signed, Lura Sallies, MD, MPH, [facility]. 07/07/2023 11:45 AM    [facility]

## 2023-08-12 ENCOUNTER — Ambulatory Visit

## 2023-08-12 DIAGNOSIS — R002 Palpitations: Secondary | ICD-10-CM

## 2023-08-12 LAB — ECHOCARDIOGRAM COMPLETE
Area-P 1/2: 4.64 cm2
S' Lateral: 3.1 cm

## 2023-08-13 ENCOUNTER — Ambulatory Visit: Payer: Self-pay

## 2023-08-18 ENCOUNTER — Ambulatory Visit

## 2023-08-18 ENCOUNTER — Telehealth: Payer: Self-pay | Admitting: *Deleted

## 2023-08-18 VITALS — BP 100/70 | HR 75 | Ht 59.0 in | Wt 205.0 lb

## 2023-08-18 DIAGNOSIS — I4711 Inappropriate sinus tachycardia, so stated: Secondary | ICD-10-CM

## 2023-08-18 HISTORY — DX: Inappropriate sinus tachycardia, so stated: I47.11

## 2023-08-18 MED ORDER — METOPROLOL TARTRATE 25 MG PO TABS: 12.5000 mg | ORAL_TABLET | Freq: Two times a day (BID) | ORAL | 3 refills | Status: AC

## 2023-08-18 NOTE — Progress Notes (Signed)
 Cardiology Consultation:    Date:  08/18/2023   ID:  Connie Mcclain, DOB 1995-03-08, MRN [id number]  PCP:  Kristi Lannis Hamel, MD  Cardiologist:  Alean JONELLE Kobus, MD   Referring MD: Kobus Alean JONELLE, *   No chief complaint on file.    ASSESSMENT AND PLAN:   [name] 28 year old woman history of diabetes mellitus diagnosed about a year ago, hyperlipidemia, anxiety, ADHD [uses Adderall occasionally as needed], obesity, smokes marijuana daily. Does not use tobacco or alcohol seen for further evaluation of palpitations associated with sense of elevated heart rate noted to be sinus tachycardia at prior healthcare visits  Zio patch results data available only for 5 hours from 07/07/2023 noted sinus tachycardia. Echocardiogram 08/12/2023 was reassuring with normal biventricular function and diastolic function without valve abnormalities.  Symptoms appear to be related to elevated heart rates in the setting of anxiety and stress and cannot entirely exclude a component of inappropriate sinus tachycardia.  Problem List Items Addressed This Visit     Inappropriate sinus tachycardia (HCC) - Primary   No obvious cause to explain her symptoms of sinus tachycardia.  anxiety and stress may be playing a role.  Discussed and reassured her about the workup thus far. Encouraged her to regularly exercise 30 to 40 minutes a day at least 5 times a week  of mild to moderate intensity exercise, such as brisk walking.  Advised to continue with calorie deficit diet.  Discussed pharmacotherapy with possible beta-blockade using metoprolol  tartrate low-dose 12.5 mg twice daily. Since her depression symptoms are well-controlled and most of her issues are related to anxiety, I feel low-dose beta-blocker should be reasonable. She is not a candidate for calcium channel blockers with baseline soft blood pressures. She is agreeable Will prescribe metoprolol  tartrate 12.5 mg twice  daily.  Overall reassured her about the findings thus far and we will follow-up with her tentatively in 6 months.          History of Present Illness:    Connie Mcclain is a 28 y.o. female who is being seen today for follow-up visit. Last visit with me in the office was 07/07/2023.  Pleasant woman here for the visit by herself.  Works an Paramedic job for a [employer]  history of diabetes mellitus diagnosed about a year ago, hyperlipidemia, anxiety, ADHD [uses Adderall occasionally as needed], obesity, smokes marijuana daily. Does not use tobacco or alcohol seen for further evaluation of palpitations associated with sense of elevated heart rate noted to be sinus tachycardia at prior healthcare visits    Heart monitor 07/07/2023 low she had it for 2 weeks data available only for 5 hours after the artifact was removed, noted average heart rate 113/min ranging from 91 to 144 bpm, no ventricular ectopy. Rare supraventricular ectopy.   Reviewed the tracings myself.  Echocardiogram report from 08/12/2023 noted normal biventricular function with LVEF 60 to 65%, normal diastolic function, no significant valve abnormalities.  Since her last visit with us  she has been keeping herself busy doing walking with her dog up to couple miles daily.  Still continues to feel elevated heart rates and symptoms of fast heartbeat at times and gets anxious. Mentions she has been significantly stressed out about any potential heart related health issues.  Mentions overall her mood has been easing up and feels her anxiety and depression symptoms are well-controlled.   Past Medical History:  Diagnosis Date   Anxiety    Glaucoma    Rapid heart rate  Past Surgical History:  Procedure Laterality Date   EYE SURGERY     WISDOM TOOTH EXTRACTION      Current Medications: Current Meds  Medication Sig   ALPRAZolam (XANAX) 1 MG tablet Take 1 mg by mouth daily as needed for anxiety.    amphetamine-dextroamphetamine (ADDERALL) 20 MG tablet Take 20 mg by mouth daily.   aspirin EC 81 MG tablet Take 81 mg by mouth daily.   cyclobenzaprine  (FLEXERIL ) 10 MG tablet Take 1 tablet (10 mg total) by mouth 2 (two) times daily as needed for muscle spasms.   folic acid (FOLVITE) 1 MG tablet Take 1 mg by mouth daily.   gabapentin (NEURONTIN) 800 MG tablet Take 800 mg by mouth 2 (two) times daily.   ibuprofen  (ADVIL ,MOTRIN ) 600 MG tablet Take 1 tablet (600 mg total) by mouth every 6 (six) hours as needed.   ketorolac (ACULAR) 0.5 % ophthalmic solution Place 1 drop into the left eye daily.   LamoTRIgine (LAMICTAL XR) 300 MG TB24 24 hour tablet Take 300 mg by mouth daily.   latanoprost (XALATAN) 0.005 % ophthalmic solution Place 1 drop into both eyes at bedtime.   ondansetron  (ZOFRAN  ODT) 4 MG disintegrating tablet Take 1 tablet (4 mg total) by mouth every 8 (eight) hours as needed for nausea or vomiting.   risperiDONE (RISPERDAL) 0.5 MG tablet Take 0.5 mg by mouth at bedtime.   simvastatin (ZOCOR) 40 MG tablet Take 40 mg by mouth daily.   tirzepatide (MOUNJARO) 12.5 MG/0.5ML Pen Inject 12.5 mg into the skin once a week.     Allergies:   Patient has no known allergies.   Social History   Socioeconomic History   Marital status: Married    Spouse name: Not on file   Number of children: Not on file   Years of education: Not on file   Highest education level: Not on file  Occupational History   Not on file  Tobacco Use   Smoking status: Never   Smokeless tobacco: Never  Vaping Use   Vaping status: Every Day  Substance and Sexual Activity   Alcohol use: No    Comment: social    Drug use: Never   Sexual activity: Not on file  Other Topics Concern   Not on file  Social History Narrative   Not on file   Social Drivers of Health   Financial Resource Strain: Not on file  Food Insecurity: Not on file  Transportation Needs: Not on file  Physical Activity: Not on file  Stress: Not  on file  Social Connections: Not on file     Family History: The patient's family history includes Cancer in her maternal aunt and paternal aunt; Diabetes in her father, maternal grandfather, and maternal great-grandmother; Heart disease in her maternal grandfather. There is no history of Hypertension. ROS:   Please see the history of present illness.    All 14 point review of systems negative except as described per history of present illness.  EKGs/Labs/Other Studies Reviewed:    The following studies were reviewed today:   EKG:       Recent Labs: 06/09/2023: BUN 9; Creatinine, Ser 0.62; Hemoglobin 14.6; Platelets 306; Potassium 3.6; Sodium 138; TSH 1.670  Recent Lipid Panel No results found for: CHOL, TRIG, HDL, CHOLHDL, VLDL, LDLCALC, LDLDIRECT  Physical Exam:    VS:  BP 100/70   Pulse 75   Ht 4' 11 (1.499 m)   Wt 205 lb (93 kg)   SpO2 97%  BMI 41.40 kg/m     Wt Readings from Last 3 Encounters:  08/18/23 205 lb (93 kg)  07/07/23 208 lb 6.4 oz (94.5 kg)  06/09/23 211 lb (95.7 kg)     GENERAL:  Well nourished, well developed in no acute distress NECK: No JVD; No carotid bruits CARDIAC: RRR, S1 and S2 present, no murmurs, no rubs, no gallops CHEST:  Clear to auscultation without rales, wheezing or rhonchi  Extremities: No pitting pedal edema. Pulses bilaterally symmetric with radial 2+ and dorsalis pedis 2+ NEUROLOGIC:  Alert and oriented x 3  Medication Adjustments/Labs and Tests Ordered: Current medicines are reviewed at length with the patient today.  Concerns regarding medicines are outlined above.  No orders of the defined types were placed in this encounter.  No orders of the defined types were placed in this encounter.   Signed, Alean jess Kobus, MD, MPH, [facility]. 08/18/2023 3:59 PM    [facility]

## 2023-08-18 NOTE — Assessment & Plan Note (Signed)
 No obvious cause to explain her symptoms of sinus tachycardia.  anxiety and stress may be playing a role.  Discussed and reassured her about the workup thus far. Encouraged her to regularly exercise 30 to 40 minutes a day at least 5 times a week  of mild to moderate intensity exercise, such as brisk walking.  Advised to continue with calorie deficit diet.  Discussed pharmacotherapy with possible beta-blockade using metoprolol  tartrate low-dose 12.5 mg twice daily. Since her depression symptoms are well-controlled and most of her issues are related to anxiety, I feel low-dose beta-blocker should be reasonable. She is not a candidate for calcium channel blockers with baseline soft blood pressures. She is agreeable Will prescribe metoprolol  tartrate 12.5 mg twice daily.  Overall reassured her about the findings thus far and we will follow-up with her tentatively in 6 months.

## 2023-08-18 NOTE — Patient Instructions (Addendum)
 Medication Instructions:  Your physician recommends the following medication changes.  START TAKING: Metoprolol  Tartrate 12.5 mg by mouth twice daily  Continue all other medications as prescribed  If you need a refill on your cardiac medications before your next appointment, please call your pharmacy*  Lab Work: No labs ordered today   If you have labs (blood work) drawn today and your tests are completely normal, you will receive your results only by: MyChart Message (if you have MyChart) OR A paper copy in the mail If you have any lab test that is abnormal or we need to change your treatment, we will call you to review the results.  Testing/Procedures:  No test ordered today   Follow-Up: At [facility], you and your health needs are our priority.  As part of our continuing mission to provide you with exceptional heart care, our providers are all part of one team.  This team includes your primary Cardiologist (physician) and Advanced Practice Providers or APPs (Physician Assistants and Nurse Practitioners) who all work together to provide you with the care you need, when you need it.  Your next appointment:   6 month(s)  Provider:   Alean Kobus, MD    We recommend signing up for the patient portal called MyChart.  Sign up information is provided on this After Visit Summary.  MyChart is used to connect with patients for Virtual Visits (Telemedicine).  Patients are able to view lab/test results, encounter notes, upcoming appointments, etc.  Non-urgent messages can be sent to your provider as well.   To learn more about what you can do with MyChart, go to [email or web address].

## 2023-08-18 NOTE — Telephone Encounter (Signed)
 Spoke with pt about Zio. Let her know we only had 5 hours of data. Pt stated that she wore it for over a week and then sent it back once it fell off. Pt was asked if she would wear another monitor and she stated that the monitor was very uncomfortable and would prefer not to but that it is up to the doctor. Her echocardiogram was normal and so not sure of the need for another monitor. Will send this message to Dr. Liborio and will wait for response from him. Pt verbalized understanding and had no further questions.

## 2023-08-23 ENCOUNTER — Telehealth: Payer: Self-pay | Admitting: *Deleted

## 2023-08-23 NOTE — Telephone Encounter (Signed)
 Hello, I have placed a no charge on this device for very limited data and the patient will not be charged. This email came from [place] at [place], will let pt know.

## 2023-08-23 NOTE — Telephone Encounter (Deleted)
 I have reached out to the company that manages the heart monitor you wore per Dr. Liborio. Our representative emailed me back to say that they will not be charging you for the monitor due to not enough data captured. Hope you have a pleasant day. Connie Mcclain

## 2023-08-23 NOTE — Telephone Encounter (Signed)
 Sent email to [employer] Ilah Miu), regarding the monitor pt wore. Pt stated she wore the monitor for close to 14 days and pushed the button when had symptoms. Monitor came back with only 5 hours of data on it. Don't feel pt should be charged for monitor and did not finalize the report on our side. Dr. Liborio didn't feel the need for further monitoring so no need for another zio. Sent email to [employer] to let them know the monitor may be defective and to request pt not be charged for this monitor.

## 2023-09-23 ENCOUNTER — Ambulatory Visit: Admitting: Cardiology

## 2023-11-21 ENCOUNTER — Encounter: Payer: Self-pay | Admitting: Family Medicine

## 2023-11-21 ENCOUNTER — Other Ambulatory Visit: Payer: Self-pay | Admitting: Family Medicine

## 2023-11-21 DIAGNOSIS — M7989 Other specified soft tissue disorders: Secondary | ICD-10-CM

## 2023-11-24 ENCOUNTER — Encounter: Payer: Self-pay | Admitting: Family Medicine

## 2023-12-06 ENCOUNTER — Ambulatory Visit
Admission: RE | Admit: 2023-12-06 | Discharge: 2023-12-06 | Disposition: A | Source: Ambulatory Visit | Attending: Family Medicine | Admitting: Family Medicine

## 2023-12-06 DIAGNOSIS — M7989 Other specified soft tissue disorders: Secondary | ICD-10-CM

## 2024-02-20 ENCOUNTER — Ambulatory Visit

## 2024-03-01 ENCOUNTER — Ambulatory Visit

## 2024-03-05 ENCOUNTER — Ambulatory Visit
